# Patient Record
Sex: Male | Born: 1995 | Race: White | Hispanic: No | Marital: Married | Smoking: Never smoker
Health system: Southern US, Community
[De-identification: ages and names within clinical notes are randomized; demographics above are authoritative.]

## PROBLEM LIST (undated history)

## (undated) DIAGNOSIS — F909 Attention-deficit hyperactivity disorder, unspecified type: Secondary | ICD-10-CM

## (undated) DIAGNOSIS — S37039A Laceration of unspecified kidney, unspecified degree, initial encounter: Secondary | ICD-10-CM

---
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
## 2015-05-24 ENCOUNTER — Ambulatory Visit (HOSPITAL_COMMUNITY): Admission: RE | Admit: 2015-05-24 | Payer: BC Managed Care – PPO | Source: Home / Self Care | Admitting: Psychiatry

## 2015-07-11 ENCOUNTER — Telehealth: Payer: Self-pay | Admitting: Pediatrics

## 2015-07-11 MED ORDER — AMPHETAMINE SULFATE 10 MG PO TABS: 10.0000 mg | ORAL_TABLET | Freq: Two times a day (BID) | ORAL | Status: DC

## 2015-07-11 NOTE — Telephone Encounter (Addendum)
Needs refill Printed evekeo 10 mg bid and given to mother Francisco Boyd has dropped out of school for now Is seeing Dr. Marlyne Boyd to get meds stabilized and r/o bipolar

## 2016-05-12 ENCOUNTER — Emergency Department (HOSPITAL_BASED_OUTPATIENT_CLINIC_OR_DEPARTMENT_OTHER): Payer: BC Managed Care – PPO

## 2016-05-12 ENCOUNTER — Emergency Department (HOSPITAL_BASED_OUTPATIENT_CLINIC_OR_DEPARTMENT_OTHER)
Admission: EM | Admit: 2016-05-12 | Discharge: 2016-05-12 | Disposition: A | Discharge status: Home / Self Care | Payer: BC Managed Care – PPO | Attending: Emergency Medicine | Admitting: Emergency Medicine

## 2016-05-12 ENCOUNTER — Encounter (HOSPITAL_BASED_OUTPATIENT_CLINIC_OR_DEPARTMENT_OTHER): Payer: Self-pay | Admitting: Emergency Medicine

## 2016-05-12 DIAGNOSIS — Y999 Unspecified external cause status: Secondary | ICD-10-CM | POA: Insufficient documentation

## 2016-05-12 DIAGNOSIS — F909 Attention-deficit hyperactivity disorder, unspecified type: Secondary | ICD-10-CM | POA: Insufficient documentation

## 2016-05-12 DIAGNOSIS — Y9355 Activity, bike riding: Secondary | ICD-10-CM | POA: Insufficient documentation

## 2016-05-12 DIAGNOSIS — Y929 Unspecified place or not applicable: Secondary | ICD-10-CM | POA: Insufficient documentation

## 2016-05-12 DIAGNOSIS — M25512 Pain in left shoulder: Secondary | ICD-10-CM

## 2016-05-12 DIAGNOSIS — S4992XA Unspecified injury of left shoulder and upper arm, initial encounter: Secondary | ICD-10-CM | POA: Diagnosis present

## 2016-05-12 MED ORDER — IBUPROFEN 800 MG PO TABS: 800.0000 mg | ORAL_TABLET | Freq: Three times a day (TID) | ORAL | 0 refills | Status: DC

## 2016-05-12 MED ORDER — IBUPROFEN 800 MG PO TABS
800.0000 mg | ORAL_TABLET | Freq: Once | ORAL | Status: AC
  Administered 2016-05-12: 800 mg via ORAL
  Filled 2016-05-12: qty 1
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
## 2016-05-12 NOTE — Discharge Instructions (Signed)
Your xray today is normal.  However, you may have injured your rotator cuff.  This may be further evaluated with an outpatient MRI if your symptoms persist.  Take 800 mg ibuprofen for pain. Wear your sling for comfort.  If your shoulder pain persists follow up with orthopedics.  Return to the ED if you develop chest pain, shortness of breath, abdominal pain, difficulty walking, confusion, persistent vomiting, or any new or concerning symptoms.

## 2017-11-29 ENCOUNTER — Emergency Department (HOSPITAL_BASED_OUTPATIENT_CLINIC_OR_DEPARTMENT_OTHER): Payer: BC Managed Care – PPO

## 2017-11-29 ENCOUNTER — Other Ambulatory Visit: Payer: Self-pay

## 2017-11-29 ENCOUNTER — Emergency Department (HOSPITAL_BASED_OUTPATIENT_CLINIC_OR_DEPARTMENT_OTHER)
Admission: EM | Admit: 2017-11-29 | Discharge: 2017-11-29 | Disposition: A | Discharge status: Home / Self Care | Payer: BC Managed Care – PPO | Attending: Emergency Medicine | Admitting: Emergency Medicine

## 2017-11-29 ENCOUNTER — Encounter (HOSPITAL_BASED_OUTPATIENT_CLINIC_OR_DEPARTMENT_OTHER): Payer: Self-pay | Admitting: Emergency Medicine

## 2017-11-29 DIAGNOSIS — Y999 Unspecified external cause status: Secondary | ICD-10-CM | POA: Insufficient documentation

## 2017-11-29 DIAGNOSIS — Z79899 Other long term (current) drug therapy: Secondary | ICD-10-CM | POA: Insufficient documentation

## 2017-11-29 DIAGNOSIS — Y929 Unspecified place or not applicable: Secondary | ICD-10-CM | POA: Insufficient documentation

## 2017-11-29 DIAGNOSIS — Y9389 Activity, other specified: Secondary | ICD-10-CM | POA: Diagnosis not present

## 2017-11-29 DIAGNOSIS — S4992XA Unspecified injury of left shoulder and upper arm, initial encounter: Secondary | ICD-10-CM | POA: Diagnosis present

## 2017-11-29 DIAGNOSIS — S42022A Displaced fracture of shaft of left clavicle, initial encounter for closed fracture: Secondary | ICD-10-CM | POA: Insufficient documentation

## 2017-11-29 MED ORDER — MORPHINE SULFATE (PF) 4 MG/ML IV SOLN
4.0000 mg | Freq: Once | INTRAVENOUS | Status: AC
Start: 2017-11-29 — End: 2017-11-29
  Administered 2017-11-29: 4 mg via INTRAVENOUS
  Filled 2017-11-29: qty 1

## 2017-11-29 MED ORDER — HYDROCODONE-ACETAMINOPHEN 5-325 MG PO TABS: 1.0000 | ORAL_TABLET | Freq: Four times a day (QID) | ORAL | 0 refills | Status: AC | PRN

## 2017-11-29 MED ORDER — FENTANYL CITRATE (PF) 100 MCG/2ML IJ SOLN
100.0000 ug | Freq: Once | INTRAMUSCULAR | Status: AC
  Administered 2017-11-29: 100 ug via INTRAVENOUS
  Filled 2017-11-29: qty 2
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
## 2017-11-29 NOTE — Discharge Instructions (Signed)
You can take Tylenol or Ibuprofen as directed for pain. You can alternate Tylenol and Ibuprofen every 4 hours. If you take Tylenol at 1pm, then you can take Ibuprofen at 5pm. Then you can take Tylenol again at 9pm.   Take the medication as directed.  Follow-up the referred for orthopedic doctor.  Please call their office and arrange for an appointment.  Apply ice to the area for reducing of swelling.  Return the emergency department for worsening pain, numbness/weakness in the arm, discoloration of the arm or any other worsening or concerning symptoms.

## 2017-11-29 NOTE — ED Notes (Signed)
ED Provider at bedside. 

## 2018-02-06 ENCOUNTER — Other Ambulatory Visit: Payer: Self-pay

## 2018-02-06 ENCOUNTER — Telehealth: Payer: Self-pay | Admitting: Psychiatry

## 2018-02-06 MED ORDER — LITHIUM CARBONATE ER 300 MG PO TBCR: 600.0000 mg | EXTENDED_RELEASE_TABLET | Freq: Every day | ORAL | 0 refills | Status: DC

## 2018-02-06 MED ORDER — LITHIUM CARBONATE ER 300 MG PO TBCR: 600.0000 mg | EXTENDED_RELEASE_TABLET | Freq: Every day | ORAL | 1 refills | Status: DC

## 2018-02-06 MED ORDER — LITHIUM CARBONATE 300 MG PO CAPS: 600.0000 mg | ORAL_CAPSULE | Freq: Every day | ORAL | 0 refills | Status: DC

## 2018-02-06 NOTE — Telephone Encounter (Signed)
Fax refill request returned manually and by Epic of Lithium carbonate 300 mg as 2 nightly as 90 day supply and refill.

## 2018-03-15 ENCOUNTER — Encounter: Payer: Self-pay | Admitting: Emergency Medicine

## 2018-03-15 DIAGNOSIS — F1221 Cannabis dependence, in remission: Secondary | ICD-10-CM | POA: Insufficient documentation

## 2018-03-15 DIAGNOSIS — F410 Panic disorder [episodic paroxysmal anxiety] without agoraphobia: Secondary | ICD-10-CM | POA: Insufficient documentation

## 2018-03-15 DIAGNOSIS — F3175 Bipolar disorder, in partial remission, most recent episode depressed: Secondary | ICD-10-CM

## 2018-03-15 DIAGNOSIS — F1220 Cannabis dependence, uncomplicated: Secondary | ICD-10-CM

## 2018-03-15 DIAGNOSIS — F900 Attention-deficit hyperactivity disorder, predominantly inattentive type: Secondary | ICD-10-CM | POA: Insufficient documentation

## 2018-03-31 ENCOUNTER — Ambulatory Visit: Payer: BC Managed Care – PPO | Admitting: Psychiatry

## 2018-03-31 ENCOUNTER — Encounter: Payer: Self-pay | Admitting: Psychiatry

## 2018-03-31 VITALS — BP 112/72 | HR 76 | Ht 70.5 in | Wt 179.0 lb

## 2018-03-31 DIAGNOSIS — F3175 Bipolar disorder, in partial remission, most recent episode depressed: Secondary | ICD-10-CM | POA: Diagnosis not present

## 2018-03-31 DIAGNOSIS — F900 Attention-deficit hyperactivity disorder, predominantly inattentive type: Secondary | ICD-10-CM | POA: Diagnosis not present

## 2018-03-31 DIAGNOSIS — F1221 Cannabis dependence, in remission: Secondary | ICD-10-CM | POA: Diagnosis not present

## 2018-03-31 DIAGNOSIS — F410 Panic disorder [episodic paroxysmal anxiety] without agoraphobia: Secondary | ICD-10-CM | POA: Diagnosis not present

## 2018-03-31 MED ORDER — BUPROPION HCL ER (XL) 150 MG PO TB24: 150.0000 mg | ORAL_TABLET | Freq: Every day | ORAL | 1 refills | Status: DC

## 2018-03-31 NOTE — Progress Notes (Signed)
[facility]  Patient ID: Francisco Boyd,  MRN: [id number]  PCP: Loyola Mast, MD  Date of Evaluation: 03/31/2018 Time spent:20 minutes  Chief Complaint:  Chief Complaint    ADHD; Depression; Anxiety      HISTORY/CURRENT STATUS: Francisco Boyd is seen individually face-to-face with consent without collateral for psychiatric interview and exam in 69-month evaluation and management of bipolar 1, ADHD, and panic disorder still having sobriety from cannabis but far short of a year.  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  He and fianc have moved to the [facility] area in an apartment and have a new puppy as well as 2 cats. They have not set a date for marriage.  The patient enjoyed his family at Thanksgiving finding them genuine, while fianc often asked questions why he is not as happy or busy talking.  He concludes depression also this time of year with the winter coming and the business pattern, and he knows that there is been no medication change in the last year and he seeks some help.  He at various times states he is compliant but not compliant with his lithium taking less as he doubts it is helping and thinks maybe it is causing him to feel tired, weak, and drowsy. I suggest this is more the depression than the lithium, and he seems to seek discontinuation of the augmentation dose of lithium in someway if he can get relief from another medication.  Depression       The patient presents with depression.  This is a recurrent problem.  The current episode started more than 1 year ago.   The onset quality is sudden.   The problem occurs intermittently.  The most recent episode lasted 6 weeks.    The problem has been waxing and waning since onset.  Associated symptoms include decreased concentration, fatigue, helplessness, hopelessness,  restlessness, decreased interest, headaches and sad.  Associated symptoms include does not have insomnia, not irritable, no appetite change, no body aches, no myalgias and no suicidal ideas.     The symptoms are aggravated by medication, social issues, work stress and family issues.  Past treatments include other medications.  Compliance with treatment is variable.  Past compliance problems include difficulty understanding directions, difficulty with treatment plan and medication issues.  Previous treatment provided moderate relief.  Risk factors include a change in medication usage/dosage, family history of mental illness, major life event, marital problems, stress, suicide in immediate family, prior psychiatric admission, history of self-injury and history of mental illness.   Past medical history includes anxiety, bipolar disorder, depression, mental health disorder and suicide attempts.     Pertinent negatives include no chronic pain, no hypothyroidism, no chronic illness, no recent illness, no life-threatening condition, no physical disability, no recent psychiatric admission, no brain trauma, no eating disorder, no obsessive-compulsive disorder, no post-traumatic stress disorder, no schizophrenia and no head trauma. Anxiety  Symptoms include decreased concentration and restlessness. Patient reports no insomnia or suicidal ideas.   His past medical history is significant for depression and suicide attempts.    Individual Medical History/ Review of Systems: Changes? :No No change in left foot postop and left clavicle fracture healed with headaches  Allergies: Patient has no known allergies.  Current Medications:  Current Outpatient Medications:  .  buPROPion (WELLBUTRIN XL) 150 MG 24 hr tablet,  Take 1 tablet (150 mg total) by mouth daily after breakfast., Disp: 30 tablet, Rfl: 1 .  ibuprofen (ADVIL,MOTRIN) 800 MG tablet, Take 1 tablet (800 mg total) by mouth 3 (three) times daily., Disp: 21 tablet,  Rfl: 0 .  lithium carbonate (LITHOBID) 300 MG CR tablet, Take 2 tablets (600 mg total) by mouth at bedtime. Take 2 tablets every Night at Bedtime., Disp: 180 tablet, Rfl: 1 Medication Side Effects: fatigue/weakness and hypersomnolence  Family Medical/ Social History: Changes?  Paternal grandfather had bipolar and father has ADHD/OCD mother is most supportive but also confrontational.  MENTAL HEALTH EXAM: Full strength 5/5, postural reflexes 0/0, and AIMS equals 0 Blood pressure 112/72, pulse 76, height 5' 10.5" (1.791 m), weight 179 lb (81.2 kg).Body mass index is 25.32 kg/m.  General Appearance: Fairly Groomed and Guarded  Eye Contact:  Fair  Speech:  Clear and Coherent and Slow  Volume:  Normal  Mood:  Anxious, Depressed, Dysphoric and Worthless  Affect:  Inappropriate, Labile, Full Range and Restricted  Thought Process:  Coherent and Goal Directed  Orientation:  Full (Time, Place, and Person)  Thought Content: Obsessions, Paranoid Ideation and Rumination   Suicidal Thoughts:  No  Homicidal Thoughts:  No  Memory:  Immediate;   Fair Remote;   Good  Judgement:  Fair  Insight:  Fair  Psychomotor Activity:  Mannerisms  Concentration:  Concentration: Fair and Attention Span: Fair  Recall:  [employer]  Fund of Knowledge: Fair  Language: Fair  Assets:  Desire for Improvement Physical Health Resilience  ADL's:  Intact  Cognition: WNL  Prognosis:  Fair    DIAGNOSES:    ICD-10-CM   1. Bipolar 1 disorder, depressed, partial remission (HCC) F31.75 buPROPion (WELLBUTRIN XL) 150 MG 24 hr tablet  2. Panic disorder F41.0   3. 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 Cannabis use disorder, moderate, in early remission [facility](HCC) F12.21     Receiving Psychotherapy: No Having recommended Elio Forgethris Andrews, [facility] to replace Eyvonne MechanicLarry Willett, PhD in past.   RECOMMENDATIONS: He doubts Strattera taken previously for ADHD can be  of any benefit to anxiety and ADHD needs while Wellbutrin is much more likely to be of benefit to mood and focus.  He agrees to start Wellbutrin 150 mg XL every morning refusing a higher dose in planning.  He agrees to continue the lithium 600 mg ER nightly having current supply at least until 4 weeks of Wellbutrin are underway and efficacy established discussing the favorable aspects of continuing the lithium in an ongoing fashion if willing.  Prescribed a 30-day supply of the Wellbutrin with 1 refill having current supply of lithium to return in 6 weeks medication sent to [facility] at [address]. We process stress reduction, relationship building, and the value of his responsibility being assumed off of cannabis.  Lithium laboratory monitoring possible for reassurance as well on/off benefit assessment be undertaken expecting in the next 3 months to stabilize symptoms as he is pleased with but somewhat bored with having a year on stable medication until now.   Chauncey MannGlenn E Jennings, MD

## 2018-04-22 ENCOUNTER — Other Ambulatory Visit: Payer: Self-pay | Admitting: Psychiatry

## 2018-04-22 DIAGNOSIS — F900 Attention-deficit hyperactivity disorder, predominantly inattentive type: Secondary | ICD-10-CM

## 2018-04-22 DIAGNOSIS — F3175 Bipolar disorder, in partial remission, most recent episode depressed: Secondary | ICD-10-CM

## 2018-05-12 ENCOUNTER — Encounter: Payer: Self-pay | Admitting: Psychiatry

## 2018-05-12 ENCOUNTER — Ambulatory Visit: Payer: BC Managed Care – PPO | Admitting: Psychiatry

## 2018-05-12 VITALS — BP 112/64 | HR 68 | Ht 70.5 in | Wt 178.0 lb

## 2018-05-12 DIAGNOSIS — F410 Panic disorder [episodic paroxysmal anxiety] without agoraphobia: Secondary | ICD-10-CM | POA: Diagnosis not present

## 2018-05-12 DIAGNOSIS — F900 Attention-deficit hyperactivity disorder, predominantly inattentive type: Secondary | ICD-10-CM

## 2018-05-12 DIAGNOSIS — F1221 Cannabis dependence, in remission: Secondary | ICD-10-CM

## 2018-05-12 DIAGNOSIS — F3175 Bipolar disorder, in partial remission, most recent episode depressed: Secondary | ICD-10-CM

## 2018-05-12 NOTE — Progress Notes (Signed)
[facility]  Patient ID: Francisco Boyd,  MRN: [id number]  PCP: Loyola MastLowe, Melissa, MD  Date of Evaluation: 05/12/2018 Time spent:10 minutes  Chief Complaint:  Chief Complaint    Panic Attack; ADHD; Depression      HISTORY/CURRENT STATUS: Francisco Boyd is seen individually face-to-face with consent not collateral for psychiatric interview and exam in 3-week evaluation and management of bipolar disorder, ADHD, and panic disorder with history of cannabis use disorder in partial remission.  In the interim, he notes improvement with Wellbutrin for motivation, focus, mood, and impulse control with self-regulation.  Nuclear family and fianc are pleased with his improved mood being happy now.  He is tolerating the winter season on the job having hired 1 employee and going to [employer] a lot himself.  His 1 year anniversary with fianc is at the end of this month and they have travel to [place] for a weekend after visiting her father in [place] recently.  They have a new puppy which is stressful and a lot of work but all her coping.  He is pleased with Wellbutrin having some Adderall benefits but none of the side effects.  He is still seeking to stop the lithium now much better prepared to make changes and monitor them.   Individual Medical History/ Review of Systems: Changes? :No   Allergies: Patient has no known allergies.  Current Medications:  Current Outpatient Medications:  .  buPROPion (WELLBUTRIN XL) 150 MG 24 hr tablet, TAKE 1 TABLET (150 MG TOTAL) BY MOUTH DAILY AFTER BREAKFAST., Disp: 90 tablet, Rfl: 0 .  ibuprofen (ADVIL,MOTRIN) 800 MG tablet, Take 1 tablet (800 mg total) by mouth 3 (three) times daily., Disp: 21 tablet, Rfl: 0 .  lithium carbonate (LITHOBID) 300 MG CR tablet, Take 2 tablets (600 mg total) by mouth at bedtime. Take 2 tablets every Night at Bedtime., Disp: 180 tablet, Rfl: 1 Medication Side Effects: none  Family Medical/ Social History: Changes? No  MENTAL  HEALTH EXAM: Full strength 5/5, postural reflexes 0/0, and AIMS equals 0. Blood pressure 112/64, pulse 68, height 5' 10.5" (1.791 m), weight 178 lb (80.7 kg).Body mass index is 25.18 kg/m.  General Appearance: Casual and Well Groomed  Eye Contact:  Good  Speech:  Clear and Coherent  Volume:  Normal  Mood:  Anxious and Euthymic  Affect:  Full Range and Anxious  Thought Process:  Goal Directed  Orientation:  Full (Time, Place, and Person)  Thought Content: Rumination   Suicidal Thoughts:  No  Homicidal Thoughts:  No  Memory:  Immediate;   Good Remote;   Good  Judgement:  Good  Insight:  Fair  Psychomotor Activity:  Normal  Concentration:  Concentration: Good and Attention Span: Fair  Recall:  Good  Fund of Knowledge: Good  Language: Fair  Assets:  Resilience Social Support Talents/Skills  ADL's:  Intact  Cognition: WNL  Prognosis:  Good    DIAGNOSES:    ICD-10-CM   1. 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 Panic disorder F41.0   4. 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  As per last appointment, he now insists upon discontinuing his lithium and continuing his Wellbutrin low-dose alone.  He processes a taper over several  weeks dropping to 300 mg ER taken in the morning with Wellbutrin down from the 600 mg ER of bedtime dosing in the past having discontinued multiple times in the past.  He continues Wellbutrin 150 mg XL every morning having a 90-day supply to return in 2 months.   Chauncey Mann, MD

## 2018-07-06 ENCOUNTER — Ambulatory Visit: Payer: BC Managed Care – PPO | Admitting: Psychiatry

## 2018-07-17 ENCOUNTER — Other Ambulatory Visit: Payer: Self-pay | Admitting: Psychiatry

## 2018-07-17 DIAGNOSIS — F900 Attention-deficit hyperactivity disorder, predominantly inattentive type: Secondary | ICD-10-CM

## 2018-07-17 DIAGNOSIS — F3175 Bipolar disorder, in partial remission, most recent episode depressed: Secondary | ICD-10-CM

## 2018-10-20 ENCOUNTER — Other Ambulatory Visit: Payer: Self-pay | Admitting: Psychiatry

## 2018-10-20 DIAGNOSIS — F900 Attention-deficit hyperactivity disorder, predominantly inattentive type: Secondary | ICD-10-CM

## 2018-10-20 DIAGNOSIS — F3175 Bipolar disorder, in partial remission, most recent episode depressed: Secondary | ICD-10-CM

## 2018-11-26 ENCOUNTER — Other Ambulatory Visit: Payer: Self-pay | Admitting: Psychiatry

## 2018-11-26 DIAGNOSIS — F900 Attention-deficit hyperactivity disorder, predominantly inattentive type: Secondary | ICD-10-CM

## 2018-11-26 DIAGNOSIS — F3175 Bipolar disorder, in partial remission, most recent episode depressed: Secondary | ICD-10-CM

## 2018-11-27 NOTE — Telephone Encounter (Signed)
Requesting 90 day still no appt scheduled.

## 2019-02-18 ENCOUNTER — Other Ambulatory Visit: Payer: Self-pay

## 2019-02-18 DIAGNOSIS — Z20822 Contact with and (suspected) exposure to covid-19: Secondary | ICD-10-CM

## 2019-02-19 ENCOUNTER — Other Ambulatory Visit: Payer: Self-pay

## 2019-02-20 LAB — NOVEL CORONAVIRUS, NAA: SARS-CoV-2, NAA: NOT DETECTED

## 2019-03-03 ENCOUNTER — Other Ambulatory Visit: Payer: Self-pay

## 2019-03-03 DIAGNOSIS — Z20822 Contact with and (suspected) exposure to covid-19: Secondary | ICD-10-CM

## 2019-03-04 LAB — NOVEL CORONAVIRUS, NAA: SARS-CoV-2, NAA: NOT DETECTED
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
## 2020-02-16 ENCOUNTER — Encounter: Payer: Self-pay | Admitting: Psychiatry

## 2022-05-16 DIAGNOSIS — F988 Other specified behavioral and emotional disorders with onset usually occurring in childhood and adolescence: Secondary | ICD-10-CM | POA: Diagnosis not present

## 2022-08-07 DIAGNOSIS — H1045 Other chronic allergic conjunctivitis: Secondary | ICD-10-CM | POA: Diagnosis not present

## 2022-08-07 DIAGNOSIS — J3089 Other allergic rhinitis: Secondary | ICD-10-CM | POA: Diagnosis not present

## 2022-08-07 DIAGNOSIS — R052 Subacute cough: Secondary | ICD-10-CM | POA: Diagnosis not present

## 2022-09-28 ENCOUNTER — Other Ambulatory Visit: Payer: Self-pay

## 2022-09-28 ENCOUNTER — Emergency Department (HOSPITAL_BASED_OUTPATIENT_CLINIC_OR_DEPARTMENT_OTHER): Payer: BC Managed Care – PPO

## 2022-09-28 DIAGNOSIS — S60221A Contusion of right hand, initial encounter: Secondary | ICD-10-CM | POA: Insufficient documentation

## 2022-09-28 DIAGNOSIS — Y92410 Unspecified street and highway as the place of occurrence of the external cause: Secondary | ICD-10-CM | POA: Insufficient documentation

## 2022-09-28 DIAGNOSIS — M79641 Pain in right hand: Secondary | ICD-10-CM | POA: Diagnosis not present
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
## 2022-09-29 ENCOUNTER — Emergency Department (HOSPITAL_BASED_OUTPATIENT_CLINIC_OR_DEPARTMENT_OTHER)
Admission: EM | Admit: 2022-09-29 | Discharge: 2022-09-29 | Disposition: A | Discharge status: Home / Self Care | Payer: BC Managed Care – PPO | Attending: Emergency Medicine | Admitting: Emergency Medicine

## 2022-09-29 DIAGNOSIS — S60221A Contusion of right hand, initial encounter: Secondary | ICD-10-CM

## 2022-09-29 MED ORDER — NAPROXEN 250 MG PO TABS
500.0000 mg | ORAL_TABLET | Freq: Once | ORAL | Status: AC
  Administered 2022-09-29: 500 mg via ORAL
  Filled 2022-09-29: qty 2

## 2022-09-29 MED ORDER — NAPROXEN 375 MG PO TABS: ORAL_TABLET | ORAL | 0 refills | Status: DC
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
## 2023-02-25 DIAGNOSIS — H6123 Impacted cerumen, bilateral: Secondary | ICD-10-CM | POA: Diagnosis not present

## 2023-03-12 DIAGNOSIS — F4321 Adjustment disorder with depressed mood: Secondary | ICD-10-CM | POA: Diagnosis not present

## 2023-06-05 DIAGNOSIS — Z8639 Personal history of other endocrine, nutritional and metabolic disease: Secondary | ICD-10-CM | POA: Diagnosis not present

## 2023-06-05 DIAGNOSIS — F988 Other specified behavioral and emotional disorders with onset usually occurring in childhood and adolescence: Secondary | ICD-10-CM | POA: Diagnosis not present

## 2023-06-30 DIAGNOSIS — Z0000 Encounter for general adult medical examination without abnormal findings: Secondary | ICD-10-CM | POA: Diagnosis not present

## 2023-06-30 DIAGNOSIS — Z13220 Encounter for screening for lipoid disorders: Secondary | ICD-10-CM | POA: Diagnosis not present

## 2023-07-08 DIAGNOSIS — R030 Elevated blood-pressure reading, without diagnosis of hypertension: Secondary | ICD-10-CM | POA: Diagnosis not present

## 2023-07-08 DIAGNOSIS — Z23 Encounter for immunization: Secondary | ICD-10-CM | POA: Diagnosis not present

## 2023-07-08 DIAGNOSIS — Z0000 Encounter for general adult medical examination without abnormal findings: Secondary | ICD-10-CM | POA: Diagnosis not present

## 2023-07-15 DIAGNOSIS — I10 Essential (primary) hypertension: Secondary | ICD-10-CM | POA: Diagnosis not present

## 2023-07-15 DIAGNOSIS — F988 Other specified behavioral and emotional disorders with onset usually occurring in childhood and adolescence: Secondary | ICD-10-CM | POA: Diagnosis not present

## 2023-07-15 DIAGNOSIS — F411 Generalized anxiety disorder: Secondary | ICD-10-CM | POA: Diagnosis not present

## 2023-08-20 DIAGNOSIS — F988 Other specified behavioral and emotional disorders with onset usually occurring in childhood and adolescence: Secondary | ICD-10-CM | POA: Diagnosis not present

## 2023-08-20 DIAGNOSIS — I10 Essential (primary) hypertension: Secondary | ICD-10-CM | POA: Diagnosis not present

## 2023-09-08 DIAGNOSIS — Z23 Encounter for immunization: Secondary | ICD-10-CM | POA: Diagnosis not present

## 2023-09-14 ENCOUNTER — Other Ambulatory Visit: Payer: Self-pay

## 2023-09-14 ENCOUNTER — Emergency Department (HOSPITAL_BASED_OUTPATIENT_CLINIC_OR_DEPARTMENT_OTHER): Admitting: Radiology

## 2023-09-14 ENCOUNTER — Emergency Department (HOSPITAL_BASED_OUTPATIENT_CLINIC_OR_DEPARTMENT_OTHER)

## 2023-09-14 ENCOUNTER — Emergency Department (HOSPITAL_BASED_OUTPATIENT_CLINIC_OR_DEPARTMENT_OTHER)
Admission: EM | Admit: 2023-09-14 | Discharge: 2023-09-14 | Disposition: A | Discharge status: Home / Self Care | Attending: Emergency Medicine | Admitting: Emergency Medicine

## 2023-09-14 ENCOUNTER — Encounter (HOSPITAL_BASED_OUTPATIENT_CLINIC_OR_DEPARTMENT_OTHER): Payer: Self-pay

## 2023-09-14 DIAGNOSIS — I10 Essential (primary) hypertension: Secondary | ICD-10-CM | POA: Diagnosis not present

## 2023-09-14 DIAGNOSIS — R0789 Other chest pain: Secondary | ICD-10-CM | POA: Diagnosis not present

## 2023-09-14 DIAGNOSIS — M79604 Pain in right leg: Secondary | ICD-10-CM | POA: Insufficient documentation

## 2023-09-14 DIAGNOSIS — R079 Chest pain, unspecified: Secondary | ICD-10-CM | POA: Diagnosis not present

## 2023-09-14 LAB — CBC
HCT: 42.6 % (ref 39.0–52.0)
Hemoglobin: 14.7 g/dL (ref 13.0–17.0)
MCH: 30.9 pg (ref 26.0–34.0)
MCHC: 34.5 g/dL (ref 30.0–36.0)
MCV: 89.7 fL (ref 80.0–100.0)
Platelets: 306 10*3/uL (ref 150–400)
RBC: 4.75 MIL/uL (ref 4.22–5.81)
RDW: 12.3 % (ref 11.5–15.5)
WBC: 8.9 10*3/uL (ref 4.0–10.5)
nRBC: 0 % (ref 0.0–0.2)

## 2023-09-14 LAB — BASIC METABOLIC PANEL WITH GFR
Anion gap: 14 (ref 5–15)
BUN: 13 mg/dL (ref 6–20)
CO2: 24 mmol/L (ref 22–32)
Calcium: 9.8 mg/dL (ref 8.9–10.3)
Chloride: 101 mmol/L (ref 98–111)
Creatinine, Ser: 1.02 mg/dL (ref 0.61–1.24)
GFR, Estimated: >60 mL/min (ref >60)
Glucose, Bld: 89 mg/dL (ref 70–99)
Potassium: 4.1 mmol/L (ref 3.5–5.1)
Sodium: 139 mmol/L (ref 135–145)

## 2023-09-14 LAB — TROPONIN T, HIGH SENSITIVITY: Troponin T High Sensitivity: <15 ng/L (ref <19)

## 2023-09-14 NOTE — ED Provider Notes (Signed)
 [place] EMERGENCY DEPARTMENT AT [facility] Provider Note   CSN: [id number] Arrival date & time: 09/14/23  1632     History  Chief Complaint  Patient presents with   Hypertension   Chest Pain    Francisco Boyd is a 28 y.o. male.  28 year old male presenting with hypertension at home, chest pain.  Patient was cleaning around the house when he began to experience discomfort in his left lower chest, he describes this as a sharp pain that resolved after several seconds.  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  After experiencing the symptoms he checked his blood pressure with a wrist cuff at home, he noted systolic readings initially around 190 this and subsequently around 170.  Patient was recently started on guanfacine 2 weeks ago for his blood pressure.  He denies shortness of breath, cough, lower extremity swelling, vision changes, headache, nausea/vomiting, abdominal pain, diaphoresis.  He does complain of an intermittent sharp pain to the posterior aspect of his right leg, he is unsure if he pulled a muscle.   Hypertension Associated symptoms include chest pain.  Chest Pain      Home Medications Prior to Admission medications   Medication Sig Start Date End Date Taking? Authorizing Provider  guanFACINE (TENEX) 1 MG tablet Take 1 mg by mouth at bedtime.   Yes [provider]  buPROPion  (WELLBUTRIN  XL) 150 MG 24 hr tablet TAKE 1 TABLET (150 MG TOTAL) BY MOUTH DAILY AFTER BREAKFAST. 11/27/18   Elesa Grills, MD  lithium  300 MG tablet Take 600 mg by mouth at bedtime.    [provider]      Allergies    Patient has no known allergies.    Review of Systems   Review of Systems  Cardiovascular:  Positive for chest pain.    Physical Exam Updated Vital Signs BP 131/83 (BP Location: Right Arm)   Pulse (!) 56   Temp 98.3 F  (36.8 C)   Resp 15   Ht 5\' 10"  (1.778 m)   Wt 88.5 kg   SpO2 100%   BMI 27.98 kg/m  Physical Exam Vitals and nursing note reviewed.  HENT:     Head: Normocephalic and atraumatic.  Eyes:     Extraocular Movements: Extraocular movements intact.     Pupils: Pupils are equal, round, and reactive to light.  Cardiovascular:     Rate and Rhythm: Normal rate and regular rhythm.     Pulses:          Radial pulses are 2+ on the right side and 2+ on the left side.       Dorsalis pedis pulses are 2+ on the right side and 2+ on the left side.     Heart sounds: Normal heart sounds.  Pulmonary:     Effort: Pulmonary effort is normal.     Breath sounds: Normal breath sounds.  Abdominal:     Palpations: Abdomen is soft.  Musculoskeletal:     Cervical back: Normal range of motion.     Right lower leg: No edema.     Left lower leg: No edema.     Comments: Moves all extremities spontaneously without difficulty  Skin:    General: Skin is warm and dry.  Neurological:     General: No focal deficit present.  Mental Status: He is alert.     ED Results / Procedures / Treatments   Labs (all labs ordered are listed, but only abnormal results are displayed) Labs Reviewed  BASIC METABOLIC PANEL WITH GFR  CBC  TROPONIN T, HIGH SENSITIVITY  TROPONIN T, HIGH SENSITIVITY    EKG None  Radiology DG Chest Port 1 View Result Date: 09/14/2023 CLINICAL DATA:  [id number] Chest pain [id number] EXAM: PORTABLE CHEST 1 VIEW COMPARISON:  None Available. FINDINGS: The cardiomediastinal silhouette is normal in contour. No pleural effusion. No pneumothorax. No acute pleuroparenchymal abnormality. IMPRESSION: No acute cardiopulmonary abnormality. Electronically Signed   By: Clancy Crimes M.D.   On: 09/14/2023 17:29    Procedures Procedures    Medications Ordered in ED Medications - No data to display  ED Course/ Medical Decision Making/ A&P                                 Medical Decision  Making This patient presents to the ED for concern of elevated blood pressure at home/chest pain, this involves an extensive number of treatment options, and is a complaint that carries with it a high risk of complications and morbidity.  The differential diagnosis includes ACS, hypertensive emergency, hypertensive urgency, GERD/reflux, anxiety.   Co morbidities that complicate the patient evaluation  History of hypertension, on guanfacine   Lab Tests:  I Ordered, and personally interpreted labs.  The pertinent results include: CBC and BMP are unremarkable, troponin within normal limits.   Imaging Studies ordered:  I ordered imaging studies including chest x-ray I independently visualized and interpreted imaging which showed no acute cardiopulmonary findings I agree with the radiologist interpretation   Cardiac Monitoring: / EKG:  The patient was maintained on a cardiac monitor.  I personally viewed and interpreted the cardiac monitored which showed an underlying rhythm of: Sinus bradycardia   Test / Admission - Considered:  Physical exam is largely reassuring, normal S1/S2, lungs are clear and equal bilaterally without evidence of adventitious sounds, no lower extremity edema.  Patient's blood pressure in triage was 131/83.  Patient is afebrile, lab work is reassuring as above.  EKG is reassuring, see interpretation.  Discussed symptoms in depth with patient and his wife, he/his wife admit that he has been under a great deal of stress at work, he often tends to spiral over his blood pressure, checking it multiple times if he feels that anything is amiss.  Patient was recently seen by his primary care provider and diagnosed with hypertension, he was started on guanfacine about 2 weeks ago.  Given reassuring cardiac workup, I do not feel that additional workup is warranted at this time.  Advised patient to follow-up with his primary care provider this week to discuss his symptoms and  elevated blood pressure readings that he has seen on his blood pressure cuff at home.  Return precautions discussed.  Patient and his wife are agreeable with this plan, at this time I feel he is appropriate for discharge.    Amount and/or Complexity of Data Reviewed Labs: ordered. Radiology: ordered. ECG/medicine tests: ordered.           Final Clinical Impression(s) / ED Diagnoses Final diagnoses:  Chest pain, unspecified type    Rx / DC Orders ED Discharge Orders     None         Adolm Ahumada 09/14/23 1841    Sallyanne Creamer,  DO 09/19/23 1027

## 2023-09-14 NOTE — ED Triage Notes (Addendum)
 Patient arrives POV with complaints of elevated BP at home (systolic on the 170's-190's). Patient also reports chest pain as well. Rates pain a 5/10.  Patient also reports right leg pain as well.

## 2023-09-14 NOTE — Discharge Instructions (Signed)
 Return to the emergency department if your symptoms worsen, or if you experience shortness of breath, severe headache/vision changes, lower extremity swelling.  Follow-up with your primary care provider this week for further evaluation management of your symptoms.

## 2023-09-19 DIAGNOSIS — I10 Essential (primary) hypertension: Secondary | ICD-10-CM | POA: Diagnosis not present

## 2023-09-19 DIAGNOSIS — Z8659 Personal history of other mental and behavioral disorders: Secondary | ICD-10-CM | POA: Diagnosis not present

## 2023-09-19 DIAGNOSIS — F411 Generalized anxiety disorder: Secondary | ICD-10-CM | POA: Diagnosis not present

## 2023-09-19 DIAGNOSIS — M79606 Pain in leg, unspecified: Secondary | ICD-10-CM | POA: Diagnosis not present

## 2023-09-19 DIAGNOSIS — K219 Gastro-esophageal reflux disease without esophagitis: Secondary | ICD-10-CM | POA: Diagnosis not present

## 2023-10-10 DIAGNOSIS — F988 Other specified behavioral and emotional disorders with onset usually occurring in childhood and adolescence: Secondary | ICD-10-CM | POA: Diagnosis not present

## 2023-10-10 DIAGNOSIS — I10 Essential (primary) hypertension: Secondary | ICD-10-CM | POA: Diagnosis not present

## 2023-10-10 DIAGNOSIS — K219 Gastro-esophageal reflux disease without esophagitis: Secondary | ICD-10-CM | POA: Diagnosis not present

## 2024-01-07 DIAGNOSIS — G4700 Insomnia, unspecified: Secondary | ICD-10-CM | POA: Diagnosis not present

## 2024-01-07 DIAGNOSIS — F411 Generalized anxiety disorder: Secondary | ICD-10-CM | POA: Diagnosis not present

## 2024-01-07 DIAGNOSIS — I10 Essential (primary) hypertension: Secondary | ICD-10-CM | POA: Diagnosis not present

## 2024-01-07 DIAGNOSIS — F331 Major depressive disorder, recurrent, moderate: Secondary | ICD-10-CM | POA: Diagnosis not present

## 2024-01-12 DIAGNOSIS — Z23 Encounter for immunization: Secondary | ICD-10-CM | POA: Diagnosis not present

## 2024-02-23 DIAGNOSIS — I10 Essential (primary) hypertension: Secondary | ICD-10-CM | POA: Diagnosis not present

## 2024-02-23 DIAGNOSIS — F988 Other specified behavioral and emotional disorders with onset usually occurring in childhood and adolescence: Secondary | ICD-10-CM | POA: Diagnosis not present

## 2024-02-23 DIAGNOSIS — F909 Attention-deficit hyperactivity disorder, unspecified type: Secondary | ICD-10-CM | POA: Diagnosis not present

## 2024-02-23 DIAGNOSIS — Z79899 Other long term (current) drug therapy: Secondary | ICD-10-CM | POA: Diagnosis not present

## 2024-03-03 DIAGNOSIS — Z79899 Other long term (current) drug therapy: Secondary | ICD-10-CM | POA: Diagnosis not present

## 2024-03-03 DIAGNOSIS — F909 Attention-deficit hyperactivity disorder, unspecified type: Secondary | ICD-10-CM | POA: Diagnosis not present

## 2024-03-03 DIAGNOSIS — F3131 Bipolar disorder, current episode depressed, mild: Secondary | ICD-10-CM | POA: Diagnosis not present

## 2024-03-03 DIAGNOSIS — F988 Other specified behavioral and emotional disorders with onset usually occurring in childhood and adolescence: Secondary | ICD-10-CM | POA: Diagnosis not present

## 2024-03-15 DIAGNOSIS — G4700 Insomnia, unspecified: Secondary | ICD-10-CM | POA: Diagnosis not present

## 2024-03-15 DIAGNOSIS — F3131 Bipolar disorder, current episode depressed, mild: Secondary | ICD-10-CM | POA: Diagnosis not present

## 2024-03-15 DIAGNOSIS — F988 Other specified behavioral and emotional disorders with onset usually occurring in childhood and adolescence: Secondary | ICD-10-CM | POA: Diagnosis not present

## 2024-03-15 DIAGNOSIS — F411 Generalized anxiety disorder: Secondary | ICD-10-CM | POA: Diagnosis not present
